# Patient Record
Sex: Male | Born: 1995 | Race: White | Hispanic: No | Marital: Single
Health system: Southern US, Community
[De-identification: ages and names within clinical notes are randomized; demographics above are authoritative.]

## PROBLEM LIST (undated history)

## (undated) DIAGNOSIS — R519 Headache, unspecified: Secondary | ICD-10-CM

## (undated) DIAGNOSIS — R1110 Vomiting, unspecified: Secondary | ICD-10-CM

## (undated) DIAGNOSIS — R011 Cardiac murmur, unspecified: Secondary | ICD-10-CM

## (undated) DIAGNOSIS — G039 Meningitis, unspecified: Secondary | ICD-10-CM

## (undated) DIAGNOSIS — R5383 Other fatigue: Secondary | ICD-10-CM

## (undated) DIAGNOSIS — K5900 Constipation, unspecified: Secondary | ICD-10-CM

## (undated) DIAGNOSIS — R51 Headache: Secondary | ICD-10-CM

## (undated) DIAGNOSIS — R110 Nausea: Secondary | ICD-10-CM

## (undated) HISTORY — DX: Headache: R51

## (undated) HISTORY — DX: Constipation, unspecified: K59.00

## (undated) HISTORY — DX: Vomiting, unspecified: R11.10

## (undated) HISTORY — PX: CIRCUMCISION: SUR203

## (undated) HISTORY — DX: Nausea: R11.0

## (undated) HISTORY — DX: Headache, unspecified: R51.9

## (undated) HISTORY — DX: Other fatigue: R53.83

---
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
## 2017-10-06 ENCOUNTER — Other Ambulatory Visit: Payer: Self-pay | Admitting: Gastroenterology

## 2017-10-06 DIAGNOSIS — R110 Nausea: Secondary | ICD-10-CM

## 2017-10-06 DIAGNOSIS — R1011 Right upper quadrant pain: Secondary | ICD-10-CM

## 2017-10-06 DIAGNOSIS — R1013 Epigastric pain: Secondary | ICD-10-CM

## 2017-10-23 ENCOUNTER — Ambulatory Visit
Admission: RE | Admit: 2017-10-23 | Discharge: 2017-10-23 | Disposition: A | Discharge status: Home / Self Care | Payer: Medicaid Other | Source: Ambulatory Visit | Attending: Gastroenterology | Admitting: Gastroenterology

## 2017-10-23 DIAGNOSIS — R1011 Right upper quadrant pain: Secondary | ICD-10-CM

## 2017-10-23 DIAGNOSIS — R1013 Epigastric pain: Secondary | ICD-10-CM

## 2017-10-23 DIAGNOSIS — R110 Nausea: Secondary | ICD-10-CM

## 2017-10-23 MED ORDER — TECHNETIUM TC 99M MEBROFENIN IV KIT
5.0000 | PACK | Freq: Once | INTRAVENOUS | Status: AC | PRN
Start: 2017-10-23 — End: 2017-10-23
  Administered 2017-10-23: 5.35 via INTRAVENOUS

## 2019-02-21 ENCOUNTER — Emergency Department
Admission: EM | Admit: 2019-02-21 | Discharge: 2019-02-21 | Disposition: A | Discharge status: Home / Self Care | Payer: Medicaid Other | Attending: Student in an Organized Health Care Education/Training Program | Admitting: Student in an Organized Health Care Education/Training Program

## 2019-02-21 ENCOUNTER — Other Ambulatory Visit: Payer: Self-pay

## 2019-02-21 ENCOUNTER — Encounter: Payer: Self-pay | Admitting: Emergency Medicine

## 2019-02-21 DIAGNOSIS — Z79899 Other long term (current) drug therapy: Secondary | ICD-10-CM | POA: Diagnosis not present

## 2019-02-21 DIAGNOSIS — J302 Other seasonal allergic rhinitis: Secondary | ICD-10-CM | POA: Insufficient documentation

## 2019-02-21 DIAGNOSIS — Z7722 Contact with and (suspected) exposure to environmental tobacco smoke (acute) (chronic): Secondary | ICD-10-CM | POA: Diagnosis not present

## 2019-02-21 DIAGNOSIS — J3489 Other specified disorders of nose and nasal sinuses: Secondary | ICD-10-CM | POA: Diagnosis present

## 2019-02-21 MED ORDER — CETIRIZINE HCL 10 MG PO TABS: 10.0000 mg | ORAL_TABLET | Freq: Every day | ORAL | 0 refills | Status: AC

## 2019-02-21 MED ORDER — OLOPATADINE HCL 0.6 % NA SOLN: 1.0000 | Freq: Two times a day (BID) | NASAL | 0 refills | Status: AC | PRN

## 2019-02-21 NOTE — Discharge Instructions (Addendum)
Please begin cetirizine daily.  You can also use Patanase spray if your Flonase is not helping.

## 2019-02-21 NOTE — ED Triage Notes (Signed)
Pt c/o sinus drainage and sore throat. PT hx of allergies. PT in NAD, VSS . Denies fever or cough

## 2019-02-21 NOTE — ED Notes (Signed)
See triage note  Presents with sinus pressure/drainage  Also has a sore throat  Low grade fever on arrival

## 2019-02-21 NOTE — ED Provider Notes (Signed)
[facility] Emergency Department Provider Note  ____________________________________________  Time seen: Approximately 10:04 AM  I have reviewed the triage vital signs and the nursing notes.   HISTORY  Chief Complaint No chief complaint on file.    HPI Dean Hudson is a 23 y.o. male that presents to the emergency department for evaluation of rhinorrhea for 2 days.  Patient states that his throat was scratchy yesterday but is better today.  Rhinorrhea has improved today as well.  No nasal congestion.  Patient has seasonal allergies.  He saw an allergy specialist in the past and was told that his allergies will be worse in the fall.  No contacts with COVID-19.  No fevers, cough, shortness of breath.  Past Medical History:  Diagnosis Date  . Constipation   . Fatigue   . Headache   . Heart murmur   . Meningitis    As a newborn  . Nausea   . Vomiting     Patient Active Problem List   Diagnosis Date Noted  . Migraine without aura 02/08/2014  . Episodic tension type headache 02/08/2014  . Eosinophilic esophagitis 01/19/2014  . Headache(784.0) 12/27/2013  . Abdominal pain, LLQ (left lower quadrant) 11/03/2013  . Nausea with vomiting   . Unspecified constipation   . Recurrent occipital headache   . Fatigue     Past Surgical History:  Procedure Laterality Date  . APPENDECTOMY  07/12/2013  . CIRCUMCISION  1999  . ESOPHAGOGASTRODUODENOSCOPY N/A 01/13/2014   Procedure: ESOPHAGOGASTRODUODENOSCOPY (EGD);  Surgeon: Jon GillsJoseph H Clark, MD;  Location: [facility] ENDOSCOPY;  Service: Endoscopy;  Laterality: N/A;    Prior to Admission medications   Medication Sig Start Date End Date Taking? Authorizing Provider  cetirizine (ZYRTEC ALLERGY) 10 MG tablet Take 1 tablet (10 mg total) by mouth daily. 02/21/19   [place] DerryWagner, Maya Arcand, PA-C  Olopatadine HCl (PATANASE) 0.6 % SOLN Place 1 spray into the nose 2 (two) times daily as needed. 02/21/19   [place] DerryWagner, Amity Roes, PA-C  pantoprazole  (PROTONIX) 20 MG tablet Take 1 tablet (20 mg total) by mouth daily. 01/19/14 01/20/15  Jon Gillslark, Joseph H, MD  SUMAtriptan (IMITREX) 50 MG tablet Take one tablet at onset of migraine with nonsteroidal medication, may repeat in 2 hours if headache persists or recurs. 05/16/14   Deetta PerlaHickling, William H, MD  topiramate (TOPAMAX) 25 MG tablet Take 2 tablets at nighttime 05/16/14   Deetta PerlaHickling, William H, MD    Allergies Patient has no known allergies.  Family History  Problem Relation Age of Onset  . Hypertension Mother   . Cholelithiasis Neg Hx   . Ulcers Neg Hx   . Celiac disease Neg Hx   . Migraines Neg Hx     Social History Social History   Tobacco Use  . Smoking status: Passive Smoke Exposure - Never Smoker  . Smokeless tobacco: Never Used  . Tobacco comment: Mom smokes   Substance Use Topics  . Alcohol use: No    Alcohol/week: 0.0 standard drinks  . Drug use: No     Review of Systems  Constitutional: No fever/chills Eyes: No visual changes. No discharge. ENT: Positive for  rhinorrhea. Cardiovascular: No chest pain. Respiratory: Negative for cough. No SOB. Gastrointestinal: No abdominal pain.  No vomiting.  No diarrhea.  No constipation. Musculoskeletal: Negative for musculoskeletal pain. Skin: Negative for rash, abrasions, lacerations, ecchymosis. Neurological: Negative for headaches.   ____________________________________________   PHYSICAL EXAM:  VITAL SIGNS: ED Triage Vitals  Enc Vitals Group  BP 02/21/19 0934 (!) 143/89     Pulse Rate 02/21/19 0934 96     Resp 02/21/19 0934 16     Temp 02/21/19 0934 99.1 F (37.3 C)     Temp Source 02/21/19 0934 Oral     SpO2 02/21/19 0934 99 %     Weight 02/21/19 0935 140 lb (63.5 kg)     Height 02/21/19 0935 5\' 8"  (1.727 m)     Head Circumference --      Peak Flow --      Pain Score 02/21/19 0934 0     Pain Loc --      Pain Edu? --      Excl. in GC? --      Constitutional: Alert and oriented. Well appearing and in  no acute distress. Eyes: Conjunctivae are normal. PERRL. EOMI. No discharge. Head: Atraumatic. ENT: No frontal and maxillary sinus tenderness.      Ears: Tympanic membranes pearly gray with good landmarks. No discharge.      Nose: Mild rhinnorhea.      Mouth/Throat: Mucous membranes are moist. Oropharynx non-erythematous. Tonsils not enlarged. No exudates. Uvula midline. Neck: No stridor.   Hematological/Lymphatic/Immunilogical: No cervical lymphadenopathy. Cardiovascular: Normal rate, regular rhythm.  Good peripheral circulation. Respiratory: Normal respiratory effort without tachypnea or retractions. Lungs CTAB. Good air entry to the bases with no decreased or absent breath sounds. Gastrointestinal: Bowel sounds 4 quadrants. Soft and nontender to palpation. No guarding or rigidity. No palpable masses. No distention. Musculoskeletal: Full range of motion to all extremities. No gross deformities appreciated. Neurologic:  Normal speech and language. No gross focal neurologic deficits are appreciated.  Skin:  Skin is warm, dry and intact. No rash noted. Psychiatric: Mood and affect are normal. Speech and behavior are normal. Patient exhibits appropriate insight and judgement.   ____________________________________________   LABS (all labs ordered are listed, but only abnormal results are displayed)  Labs Reviewed - No data to display ____________________________________________  EKG   ____________________________________________  RADIOLOGY  No results found.  ____________________________________________    PROCEDURES  Procedure(s) performed:    Procedures    Medications - No data to display   ____________________________________________   INITIAL IMPRESSION / ASSESSMENT AND PLAN / ED COURSE  Pertinent labs & imaging results that were available during my care of the patient were reviewed by me and considered in my medical decision making (see chart for  details).  Review of the [place] CSRS was performed in accordance of the [facility] prior to dispensing any controlled drugs.     Patient's diagnosis is consistent with allergic rhinitis. Vital signs and exam are reassuring. Patient appears well and is staying well hydrated. Patient feels comfortable going home. Patient will be discharged home with prescriptions for zyrtec and patanase. Patient is to follow up with primary care as needed or otherwise directed. Patient is given ED precautions to return to the ED for any worsening or new symptoms.  Dean Hudson was evaluated in Emergency Department on 02/21/2019 for the symptoms described in the history of present illness. He was evaluated in the context of the global COVID-19 pandemic, which necessitated consideration that the patient might be at risk for infection with the SARS-CoV-2 virus that causes COVID-19. Institutional protocols and algorithms that pertain to the evaluation of patients at risk for COVID-19 are in a state of rapid change based on information released by regulatory bodies including the CDC and federal and state organizations. These policies and algorithms were followed during  the patient's care in the ED.     ____________________________________________  FINAL CLINICAL IMPRESSION(S) / ED DIAGNOSES  Final diagnoses:  Seasonal allergic rhinitis, unspecified trigger      NEW MEDICATIONS STARTED DURING THIS VISIT:  ED Discharge Orders         Ordered    cetirizine (ZYRTEC ALLERGY) 10 MG tablet  Daily     02/21/19 1012    Olopatadine HCl (PATANASE) 0.6 % SOLN  2 times daily PRN     02/21/19 1015              This chart was dictated using voice recognition software/Dragon. Despite best efforts to proofread, errors can occur which can change the meaning. Any change was purely unintentional.    Laban Emperor, PA-C 02/21/19 1142    Merlyn Lot, MD 02/21/19 1235

## 2020-02-11 ENCOUNTER — Emergency Department: Payer: Medicaid Other

## 2020-02-11 ENCOUNTER — Encounter: Payer: Self-pay | Admitting: Emergency Medicine

## 2020-02-11 ENCOUNTER — Other Ambulatory Visit: Payer: Self-pay

## 2020-02-11 ENCOUNTER — Emergency Department
Admission: EM | Admit: 2020-02-11 | Discharge: 2020-02-11 | Disposition: A | Discharge status: Home / Self Care | Payer: Medicaid Other | Attending: Emergency Medicine | Admitting: Emergency Medicine

## 2020-02-11 DIAGNOSIS — S46012A Strain of muscle(s) and tendon(s) of the rotator cuff of left shoulder, initial encounter: Secondary | ICD-10-CM | POA: Insufficient documentation

## 2020-02-11 DIAGNOSIS — M542 Cervicalgia: Secondary | ICD-10-CM | POA: Insufficient documentation

## 2020-02-11 DIAGNOSIS — R519 Headache, unspecified: Secondary | ICD-10-CM | POA: Insufficient documentation

## 2020-02-11 DIAGNOSIS — Y9289 Other specified places as the place of occurrence of the external cause: Secondary | ICD-10-CM | POA: Diagnosis not present

## 2020-02-11 DIAGNOSIS — Z7722 Contact with and (suspected) exposure to environmental tobacco smoke (acute) (chronic): Secondary | ICD-10-CM | POA: Diagnosis not present

## 2020-02-11 DIAGNOSIS — W11XXXA Fall on and from ladder, initial encounter: Secondary | ICD-10-CM | POA: Insufficient documentation

## 2020-02-11 DIAGNOSIS — Y999 Unspecified external cause status: Secondary | ICD-10-CM | POA: Insufficient documentation

## 2020-02-11 DIAGNOSIS — T1490XA Injury, unspecified, initial encounter: Secondary | ICD-10-CM

## 2020-02-11 DIAGNOSIS — S20212A Contusion of left front wall of thorax, initial encounter: Secondary | ICD-10-CM | POA: Diagnosis not present

## 2020-02-11 DIAGNOSIS — Y9389 Activity, other specified: Secondary | ICD-10-CM | POA: Diagnosis not present

## 2020-02-11 DIAGNOSIS — S46912A Strain of unspecified muscle, fascia and tendon at shoulder and upper arm level, left arm, initial encounter: Secondary | ICD-10-CM

## 2020-02-11 DIAGNOSIS — S4992XA Unspecified injury of left shoulder and upper arm, initial encounter: Secondary | ICD-10-CM | POA: Diagnosis present

## 2020-02-11 LAB — COMPREHENSIVE METABOLIC PANEL
ALT: 15 U/L (ref 0–44)
AST: 35 U/L (ref 15–41)
Albumin: 5.2 g/dL — ABNORMAL HIGH (ref 3.5–5.0)
Alkaline Phosphatase: 56 U/L (ref 38–126)
Anion gap: 10 (ref 5–15)
BUN: 12 mg/dL (ref 6–20)
CO2: 26 mmol/L (ref 22–32)
Calcium: 9.7 mg/dL (ref 8.9–10.3)
Chloride: 102 mmol/L (ref 98–111)
Creatinine, Ser: 1.18 mg/dL (ref 0.61–1.24)
GFR calc Af Amer: >60 mL/min (ref >60)
GFR calc non Af Amer: >60 mL/min (ref >60)
Glucose, Bld: 90 mg/dL (ref 70–99)
Potassium: 3.4 mmol/L — ABNORMAL LOW (ref 3.5–5.1)
Sodium: 138 mmol/L (ref 135–145)
Total Bilirubin: 1.7 mg/dL — ABNORMAL HIGH (ref 0.3–1.2)
Total Protein: 7.8 g/dL (ref 6.5–8.1)

## 2020-02-11 LAB — CBC WITH DIFFERENTIAL/PLATELET
Abs Immature Granulocytes: 0.03 10*3/uL (ref 0.00–0.07)
Basophils Absolute: 0 10*3/uL (ref 0.0–0.1)
Basophils Relative: 1 %
Eosinophils Absolute: 0.1 10*3/uL (ref 0.0–0.5)
Eosinophils Relative: 1 %
HCT: 38.8 % — ABNORMAL LOW (ref 39.0–52.0)
Hemoglobin: 14.2 g/dL (ref 13.0–17.0)
Immature Granulocytes: 0 %
Lymphocytes Relative: 16 %
Lymphs Abs: 1.3 10*3/uL (ref 0.7–4.0)
MCH: 29.5 pg (ref 26.0–34.0)
MCHC: 36.6 g/dL — ABNORMAL HIGH (ref 30.0–36.0)
MCV: 80.7 fL (ref 80.0–100.0)
Monocytes Absolute: 0.8 10*3/uL (ref 0.1–1.0)
Monocytes Relative: 10 %
Neutro Abs: 6 10*3/uL (ref 1.7–7.7)
Neutrophils Relative %: 72 %
Platelets: 220 10*3/uL (ref 150–400)
RBC: 4.81 MIL/uL (ref 4.22–5.81)
RDW: 12.6 % (ref 11.5–15.5)
WBC: 8.3 10*3/uL (ref 4.0–10.5)
nRBC: 0 % (ref 0.0–0.2)

## 2020-02-11 LAB — LIPASE, BLOOD: Lipase: 26 U/L (ref 11–51)

## 2020-02-11 LAB — TROPONIN I (HIGH SENSITIVITY): Troponin I (High Sensitivity): 8 ng/L (ref <18)

## 2020-02-11 MED ORDER — ACETAMINOPHEN 325 MG PO TABS: 650.0000 mg | ORAL_TABLET | Freq: Four times a day (QID) | ORAL | 0 refills | Status: AC | PRN

## 2020-02-11 MED ORDER — KETOROLAC TROMETHAMINE 10 MG PO TABS: 10.0000 mg | ORAL_TABLET | Freq: Four times a day (QID) | ORAL | 0 refills | Status: AC | PRN

## 2020-02-11 MED ORDER — IOHEXOL 350 MG/ML SOLN
100.0000 mL | Freq: Once | INTRAVENOUS | Status: AC | PRN
  Administered 2020-02-11: 100 mL via INTRAVENOUS

## 2020-02-11 NOTE — ED Notes (Signed)
Pt transported to CT ?

## 2020-02-11 NOTE — Discharge Instructions (Addendum)
Your blood tests, shoulder x-ray, and CT scans are all okay today.  There are no serious injuries from the fall.  Use a shoulder sling as needed for comfort over the next several days, and take ketorolac and Tylenol for relief of pain and inflammation from the injury.  While taking ketorolac, do not take Aleve or ibuprofen.

## 2020-02-11 NOTE — ED Triage Notes (Addendum)
Pt was up on ladder and fell off. Fall was about 13-15 feet.  Pt unsure of how he landed but was told he landed then rolled. Pain to left shoulder.  Feels like left arm is weak.  C/o tight feeling to neck.  C/o bad headache. Denies LOC at this time.  Ambulatory, denies hip/leg pain.  philly applied in triage.  Lips appear very pale.  Pt reports not his normal color.  Describes abdomen as tensing but not having pain to abdomen.  Shivering but reports not cold.

## 2020-02-11 NOTE — ED Provider Notes (Signed)
[facility] Emergency Department Provider Note  ____________________________________________  Time seen: Approximately 4:41 PM  I have reviewed the triage vital signs and the nursing notes.   HISTORY  Chief Complaint Fall    HPI Dean Hudson is a 24 y.o. male with a past history of migraines and recurrent occipital headache who was in his usual state of health today until he fell off a ladder.  He estimates he was about 15 feet in the air and fell straight off of it, landing on his back.  Denies loss of consciousness.  He had immediate occipital headache, neck tightness, left upper chest and shoulder pain, and a feeling of weakness and pain radiating through the left arm.  Also describes some abdominal discomfort which is vague and generalized.  No vomiting, hematuria, or diarrhea.  He does endorse some nausea.  Pain is currently 5/10, constant, worse with shoulder movement.  C-collar was placed on the patient on arrival to the ED.  Injury happened at about 1:30 PM.    Past Medical History:  Diagnosis Date  . Constipation   . Fatigue   . Headache   . Heart murmur   . Meningitis    As a newborn  . Nausea   . Vomiting      Patient Active Problem List   Diagnosis Date Noted  . Migraine without aura 02/08/2014  . Episodic tension type headache 02/08/2014  . Eosinophilic esophagitis 01/19/2014  . Headache(784.0) 12/27/2013  . Abdominal pain, LLQ (left lower quadrant) 11/03/2013  . Nausea with vomiting   . Unspecified constipation   . Recurrent occipital headache   . Fatigue      Past Surgical History:  Procedure Laterality Date  . APPENDECTOMY  07/12/2013  . CIRCUMCISION  1999  . ESOPHAGOGASTRODUODENOSCOPY N/A 01/13/2014   Procedure: ESOPHAGOGASTRODUODENOSCOPY (EGD);  Surgeon: Jon Gills, MD;  Location: [facility] ENDOSCOPY;  Service: Endoscopy;  Laterality: N/A;     Prior to Admission medications   Medication Sig Start Date End Date Taking?  Authorizing Provider  acetaminophen (TYLENOL) 325 MG tablet Take 2 tablets (650 mg total) by mouth every 6 (six) hours as needed. 02/11/20   Sharman Cheek, MD  cetirizine (ZYRTEC ALLERGY) 10 MG tablet Take 1 tablet (10 mg total) by mouth daily. 02/21/19   Enid Derry, PA-C  ketorolac (TORADOL) 10 MG tablet Take 1 tablet (10 mg total) by mouth every 6 (six) hours as needed for moderate pain. 02/11/20   Sharman Cheek, MD  Olopatadine HCl (PATANASE) 0.6 % SOLN Place 1 spray into the nose 2 (two) times daily as needed. 02/21/19   Enid Derry, PA-C  pantoprazole (PROTONIX) 20 MG tablet Take 1 tablet (20 mg total) by mouth daily. 01/19/14 01/20/15  Jon Gills, MD  SUMAtriptan (IMITREX) 50 MG tablet Take one tablet at onset of migraine with nonsteroidal medication, may repeat in 2 hours if headache persists or recurs. 05/16/14   Deetta Perla, MD  topiramate (TOPAMAX) 25 MG tablet Take 2 tablets at nighttime 05/16/14   Deetta Perla, MD     Allergies Patient has no known allergies.   Family History  Problem Relation Age of Onset  . Hypertension Mother   . Cholelithiasis Neg Hx   . Ulcers Neg Hx   . Celiac disease Neg Hx   . Migraines Neg Hx     Social History Social History   Tobacco Use  . Smoking status: Passive Smoke Exposure - Never Smoker  . Smokeless  tobacco: Never Used  . Tobacco comment: Mom smokes   Substance Use Topics  . Alcohol use: No    Alcohol/week: 0.0 standard drinks  . Drug use: No    Review of Systems  Constitutional:   No fever or chills.  ENT:   No sore throat. No rhinorrhea. Cardiovascular:   Positive left upper chest pain as above without syncope. Respiratory:   No dyspnea or cough. Gastrointestinal:   Negative for abdominal pain, vomiting and diarrhea.  Musculoskeletal:   Positive left shoulder pain All other systems reviewed and are negative except as documented above in ROS and  HPI.  ____________________________________________   PHYSICAL EXAM:  VITAL SIGNS: ED Triage Vitals  Enc Vitals Group     BP 02/11/20 1524 136/88     Pulse Rate 02/11/20 1524 70     Resp 02/11/20 1524 17     Temp 02/11/20 1525 98.7 F (37.1 C)     Temp Source 02/11/20 1525 Oral     SpO2 02/11/20 1524 100 %     Weight 02/11/20 1528 138 lb (62.6 kg)     Height 02/11/20 1528 5\' 8"  (1.727 m)     Head Circumference --      Peak Flow --      Pain Score 02/11/20 1526 8     Pain Loc --      Pain Edu? --      Excl. in GC? --     Vital signs reviewed, nursing assessments reviewed.   Constitutional:   Alert and oriented. Non-toxic appearance. Eyes:   Conjunctivae are normal. EOMI. PERRL. ENT      Head:   Normocephalic and atraumatic.  Negative battle sign or raccoon eyes      Nose: No epistaxis      Mouth/Throat:   No intraoral injury      Neck:   No meningismus. Full ROM.  Trachea midline.  No midline point tenderness of the cervical spine Hematological/Lymphatic/Immunilogical:   No cervical lymphadenopathy. Cardiovascular:   RRR. Symmetric bilateral radial and DP pulses.  No murmurs. Cap refill less than 2 seconds. Respiratory:   Normal respiratory effort without tachypnea/retractions. Breath sounds are clear and equal bilaterally. No wheezes/rales/rhonchi. Gastrointestinal:   Soft and nontender. Non distended. There is no CVA tenderness.  No rebound, rigidity, or guarding. Genitourinary:   deferred Musculoskeletal:   There is tenderness of the posterior left chest wall without rib instability or flail chest.  There is tenderness at the left acromion and superior humerus, but intact shoulder range of motion. Neurologic:   Normal speech and language.  Motor grossly intact. Sensation intact and symmetric No acute focal neurologic deficits are appreciated.  Skin:    Skin is warm, dry and intact. No rash noted.  No petechiae, purpura, or  bullae.  ____________________________________________    LABS (pertinent positives/negatives) (all labs ordered are listed, but only abnormal results are displayed) Labs Reviewed  COMPREHENSIVE METABOLIC PANEL - Abnormal; Notable for the following components:      Result Value   Potassium 3.4 (*)    Albumin 5.2 (*)    Total Bilirubin 1.7 (*)    All other components within normal limits  CBC WITH DIFFERENTIAL/PLATELET - Abnormal; Notable for the following components:   HCT 38.8 (*)    MCHC 36.6 (*)    All other components within normal limits  LIPASE, BLOOD  TROPONIN I (HIGH SENSITIVITY)  TROPONIN I (HIGH SENSITIVITY)   ____________________________________________   EKG  ____________________________________________    RADIOLOGY  CT HEAD WO CONTRAST  Result Date: 02/11/2020 CLINICAL DATA:  24 year old who fell approximately 13-15 feet off of a ladder earlier today. No loss of consciousness. Cervical neck pain and tightness. LEFT shoulder pain. Initial encounter. EXAM: CT HEAD WITHOUT CONTRAST CT CERVICAL SPINE WITHOUT CONTRAST TECHNIQUE: Multidetector CT imaging of the head and cervical spine was performed following the standard protocol without intravenous contrast. Multiplanar CT image reconstructions of the cervical spine were also generated. COMPARISON:  None. FINDINGS: CT HEAD FINDINGS Brain: Ventricular system normal in size and appearance for age. No mass lesion. No midline shift. No acute hemorrhage or hematoma. No extra-axial fluid collections. No evidence of acute infarction. No focal brain parenchymal abnormalities. Vascular: No hyperdense vessel.  No visible atherosclerosis. Skull: No skull fracture or other focal osseous abnormality involving the skull. Sinuses/Orbits: Visualized paranasal sinuses, bilateral mastoid air cells and bilateral middle ear cavities well-aerated. Visualized orbits and globes normal in appearance. Other: None. CT CERVICAL SPINE FINDINGS  Alignment: Anatomic alignment. Straightening of the usual lordosis. Facet joints anatomically aligned throughout. Skull base and vertebrae: No fractures identified involving the cervical spine. Coronal reformatted images demonstrate an intact craniocervical junction, intact dens and intact lateral masses throughout Soft tissues and spinal canal: No evidence of paraspinous or spinal canal hematoma. No evidence of spinal stenosis. Disc levels: Disc spaces well-preserved throughout. No evidence of disc protrusion or extrusion. Neural foramina widely patent throughout. Upper chest: Visualized lung apices clear. Visualized superior mediastinum normal. Other: None. IMPRESSION: 1. Normal unenhanced head CT. 2. No cervical spine fractures identified. 3. Straightening of the usual cervical lordosis likely due to positioning and/or spasm. Electronically Signed   By: Hulan Saashomas  Lawrence M.D.   On: 02/11/2020 17:36   CT CERVICAL SPINE WO CONTRAST  Result Date: 02/11/2020 CLINICAL DATA:  24 year old who fell approximately 13-15 feet off of a ladder earlier today. No loss of consciousness. Cervical neck pain and tightness. LEFT shoulder pain. Initial encounter. EXAM: CT HEAD WITHOUT CONTRAST CT CERVICAL SPINE WITHOUT CONTRAST TECHNIQUE: Multidetector CT imaging of the head and cervical spine was performed following the standard protocol without intravenous contrast. Multiplanar CT image reconstructions of the cervical spine were also generated. COMPARISON:  None. FINDINGS: CT HEAD FINDINGS Brain: Ventricular system normal in size and appearance for age. No mass lesion. No midline shift. No acute hemorrhage or hematoma. No extra-axial fluid collections. No evidence of acute infarction. No focal brain parenchymal abnormalities. Vascular: No hyperdense vessel.  No visible atherosclerosis. Skull: No skull fracture or other focal osseous abnormality involving the skull. Sinuses/Orbits: Visualized paranasal sinuses, bilateral mastoid  air cells and bilateral middle ear cavities well-aerated. Visualized orbits and globes normal in appearance. Other: None. CT CERVICAL SPINE FINDINGS Alignment: Anatomic alignment. Straightening of the usual lordosis. Facet joints anatomically aligned throughout. Skull base and vertebrae: No fractures identified involving the cervical spine. Coronal reformatted images demonstrate an intact craniocervical junction, intact dens and intact lateral masses throughout Soft tissues and spinal canal: No evidence of paraspinous or spinal canal hematoma. No evidence of spinal stenosis. Disc levels: Disc spaces well-preserved throughout. No evidence of disc protrusion or extrusion. Neural foramina widely patent throughout. Upper chest: Visualized lung apices clear. Visualized superior mediastinum normal. Other: None. IMPRESSION: 1. Normal unenhanced head CT. 2. No cervical spine fractures identified. 3. Straightening of the usual cervical lordosis likely due to positioning and/or spasm. Electronically Signed   By: Hulan Saashomas  Lawrence M.D.   On: 02/11/2020 17:36   CT CHEST ABDOMEN  PELVIS W CONTRAST  Result Date: 02/11/2020 CLINICAL DATA:  Larey Seat from a ladder. Left chest pain and abdominal discomfort. EXAM: CT CHEST, ABDOMEN, AND PELVIS WITH CONTRAST TECHNIQUE: Multidetector CT imaging of the chest, abdomen and pelvis was performed following the standard protocol during bolus administration of intravenous contrast. CONTRAST:  OMNIPAQUE IOHEXOL 350 MG/ML SOLN COMPARISON:  None. FINDINGS: CT CHEST FINDINGS Cardiovascular: The heart is normal in size. No pericardial effusion. The aorta is normal in caliber. No dissection. The branch vessels are patent. The pulmonary arteries appear normal. Mediastinum/Nodes: No mediastinal or hilar mass or adenopathy or hematoma. Some residual normal thymic tissue noted in the anterior mediastinum. The esophagus is grossly normal. The thyroid gland is unremarkable. Lungs/Pleura: The lungs are  clear. No pulmonary contusion, pneumothorax or pleural effusion. No pulmonary lesions. Musculoskeletal: No rib, sternal or thoracic vertebral body fractures are identified. CT ABDOMEN PELVIS FINDINGS Hepatobiliary: No acute hepatic injury or perihepatic fluid collection. The gallbladder is unremarkable. No common bile duct dilatation. Pancreas: No mass, inflammation or ductal dilatation. No findings for acute pancreatic injury and no peripancreatic fluid collections. Spleen: Normal size. No acute injury or perisplenic fluid collection. Adrenals/Urinary Tract: The adrenal glands and kidneys are unremarkable. No acute renal injury or perinephric fluid collection. The bladder is unremarkable. Stomach/Bowel: The stomach, duodenum, small bowel and colon are grossly normal. No CT findings to suggest an acute bowel injury. No free fluid or free air. Vascular/Lymphatic: The aorta is normal in caliber. No dissection. The branch vessels are patent. The major venous structures are patent. No mesenteric or retroperitoneal mass or adenopathy. Small scattered lymph nodes are noted. Reproductive: The prostate gland and seminal vesicles are unremarkable. Other: No pelvic mass or adenopathy. No free pelvic fluid collections. No inguinal mass or adenopathy. No abdominal wall hernia or subcutaneous lesions. Musculoskeletal: The lumbar vertebral bodies are normally aligned. No fracture. The facets are normally aligned. No facet or laminar fractures. No pars defects. The pubic symphysis and SI joints are intact. Both hips are normally located. No pelvic fractures or bone lesions. IMPRESSION: Unremarkable CT examination of the chest, abdomen and pelvis. No acute injury is identified. Electronically Signed   By: Rudie Meyer M.D.   On: 02/11/2020 17:34   DG Shoulder Left  Result Date: 02/11/2020 CLINICAL DATA:  Left shoulder pain status post fall from ladder. EXAM: LEFT SHOULDER - 2+ VIEW COMPARISON:  None. FINDINGS: There is no  evidence of fracture or dislocation. There is no evidence of arthropathy or other focal bone abnormality. Soft tissues are unremarkable. IMPRESSION: Negative. Electronically Signed   By: Katherine Mantle M.D.   On: 02/11/2020 17:41    ____________________________________________   PROCEDURES Procedures  ____________________________________________  DIFFERENTIAL DIAGNOSIS   Intracranial hemorrhage, cervical spine fracture, aortic dissection, pneumothorax, rib fracture, shoulder fracture, rotator cuff/musculoskeletal strain, splenic laceration, liver laceration  CLINICAL IMPRESSION / ASSESSMENT AND PLAN / ED COURSE  Medications ordered in the ED: Medications  iohexol (OMNIPAQUE) 350 MG/ML injection 100 mL (100 mLs Intravenous Contrast Given 02/11/20 1702)    Pertinent labs & imaging results that were available during my care of the patient were reviewed by me and considered in my medical decision making (see chart for details).  Dean Hudson was evaluated in Emergency Department on 02/11/2020 for the symptoms described in the history of present illness. He was evaluated in the context of the global COVID-19 pandemic, which necessitated consideration that the patient might be at risk for infection with the SARS-CoV-2 virus that  causes COVID-19. Institutional protocols and algorithms that pertain to the evaluation of patients at risk for COVID-19 are in a state of rapid change based on information released by regulatory bodies including the CDC and federal and state organizations. These policies and algorithms were followed during the patient's care in the ED.   Patient presents after a serious mechanism of injury, 15 foot fall off a ladder with head injury, headache neck pain chest and arm pain, concerning for intracranial hemorrhage, C-spine fracture, aortic injury, fracture, hemothorax, abdominal organ laceration.  Will pursue trauma work-up with CT head and neck, CT chest abdomen pelvis,  shoulder x-ray.  Clinical Course as of Feb 11 1748  Sat Feb 11, 2020  5364 W/u reassuring. Labs normal. CT head/neck/CAP all negative. Shoulder xray interpreted by me, normal without fracture, dislocation, ptx, rib fx, or clavicle injury.    [PS]    Clinical Course User Index [PS] Sharman Cheek, MD     ____________________________________________   FINAL CLINICAL IMPRESSION(S) / ED DIAGNOSES    Final diagnoses:  Strain of left shoulder, initial encounter  Chest wall contusion, left, initial encounter     ED Discharge Orders         Ordered    ketorolac (TORADOL) 10 MG tablet  Every 6 hours PRN        02/11/20 1747    acetaminophen (TYLENOL) 325 MG tablet  Every 6 hours PRN        02/11/20 1747          Portions of this note were generated with dragon dictation software. Dictation errors may occur despite best attempts at proofreading.   Sharman Cheek, MD 02/11/20 1749

## 2021-04-12 ENCOUNTER — Emergency Department
Admission: EM | Admit: 2021-04-12 | Discharge: 2021-04-12 | Disposition: A | Discharge status: Home / Self Care | Payer: No Typology Code available for payment source | Attending: Emergency Medicine | Admitting: Emergency Medicine

## 2021-04-12 ENCOUNTER — Emergency Department: Payer: No Typology Code available for payment source

## 2021-04-12 ENCOUNTER — Other Ambulatory Visit: Payer: Self-pay

## 2021-04-12 DIAGNOSIS — S161XXA Strain of muscle, fascia and tendon at neck level, initial encounter: Secondary | ICD-10-CM | POA: Diagnosis not present

## 2021-04-12 DIAGNOSIS — S0990XA Unspecified injury of head, initial encounter: Secondary | ICD-10-CM | POA: Diagnosis not present

## 2021-04-12 DIAGNOSIS — S199XXA Unspecified injury of neck, initial encounter: Secondary | ICD-10-CM | POA: Diagnosis present

## 2021-04-12 DIAGNOSIS — Y92410 Unspecified street and highway as the place of occurrence of the external cause: Secondary | ICD-10-CM | POA: Diagnosis not present

## 2021-04-12 DIAGNOSIS — Z7722 Contact with and (suspected) exposure to environmental tobacco smoke (acute) (chronic): Secondary | ICD-10-CM | POA: Insufficient documentation

## 2021-04-12 MED ORDER — IBUPROFEN 600 MG PO TABS
600.0000 mg | ORAL_TABLET | Freq: Once | ORAL | Status: AC
  Administered 2021-04-12: 600 mg via ORAL
  Filled 2021-04-12: qty 1

## 2021-04-12 MED ORDER — BACLOFEN 10 MG PO TABS: 10.0000 mg | ORAL_TABLET | Freq: Three times a day (TID) | ORAL | 0 refills | Status: AC

## 2021-04-12 MED ORDER — MELOXICAM 15 MG PO TABS: 15.0000 mg | ORAL_TABLET | Freq: Every day | ORAL | 2 refills | Status: AC

## 2021-04-12 MED ORDER — CYCLOBENZAPRINE HCL 10 MG PO TABS
10.0000 mg | ORAL_TABLET | Freq: Once | ORAL | Status: AC
  Administered 2021-04-12: 10 mg via ORAL
  Filled 2021-04-12: qty 1

## 2021-04-12 NOTE — Discharge Instructions (Signed)
Follow-up with your regular doctor or orthopedics if not improving in 1 week.  Use medication as prescribed.  Apply ice to all areas that hurt.  Return if worsening
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
## 2021-10-11 IMAGING — CT CT CHEST-ABD-PELV W/ CM
2 of 5 series · 13 of 36 positions shown, 15 images · IV contrast (omnipaque)
Comparison: None.

CLINICAL DATA: Fell from a ladder. Left chest pain and abdominal
discomfort.

EXAM:
CT CHEST, ABDOMEN, AND PELVIS WITH CONTRAST
TECHNIQUE: Multidetector CT imaging of the chest, abdomen and pelvis was
performed following the standard protocol during bolus
administration of intravenous contrast.
CONTRAST:  100mL OMNIPAQUE IOHEXOL 350 MG/ML SOLN

[Series 2: cap with · axial · 0.85mm/px · z∈[-874,-299]mm · 10 of 141 slices shown, 12 images]
[im 13/141  mediastinal]
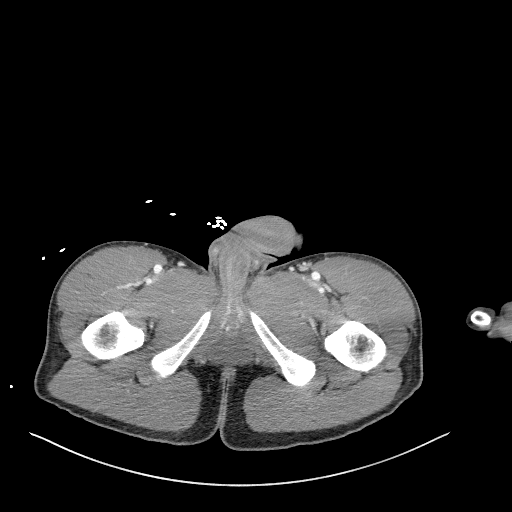
[im 13/141  bone]
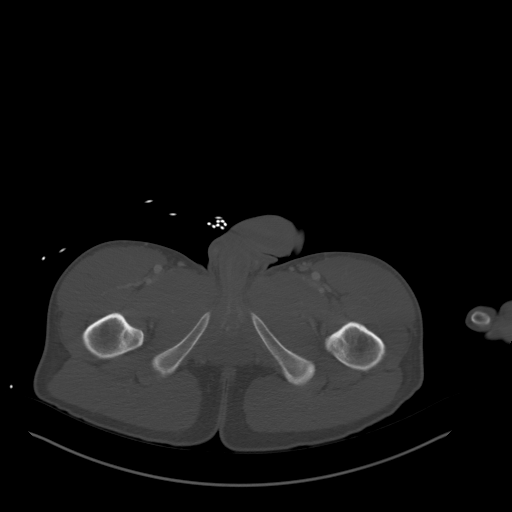
[im 26/141  mediastinal]
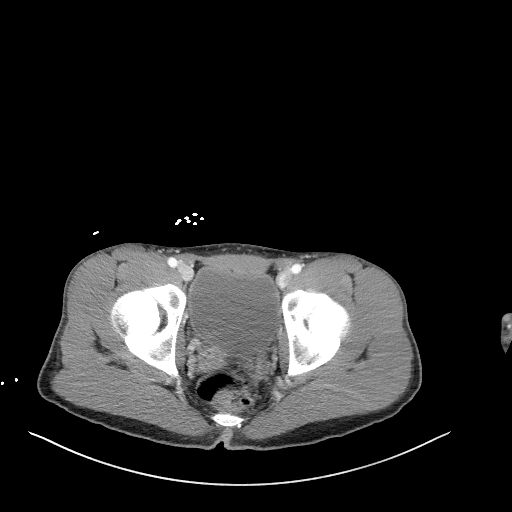
[im 39/141  mediastinal]
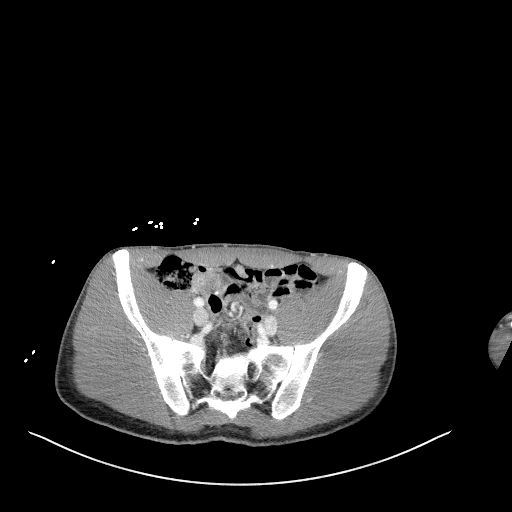
[im 51/141  mediastinal]
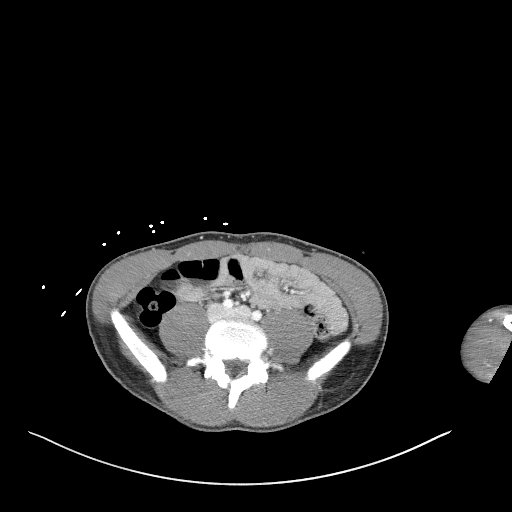
[im 64/141  mediastinal]
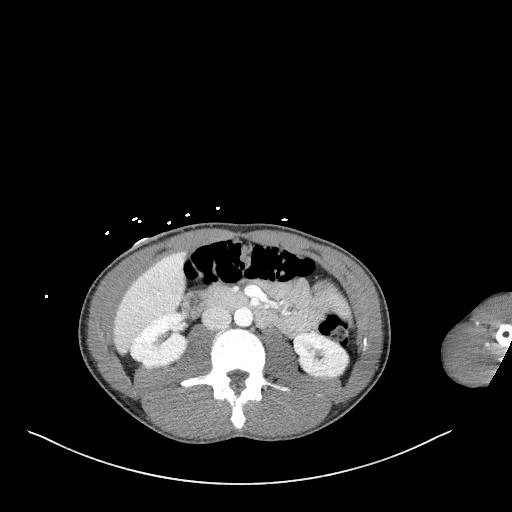
[im 77/141  mediastinal]
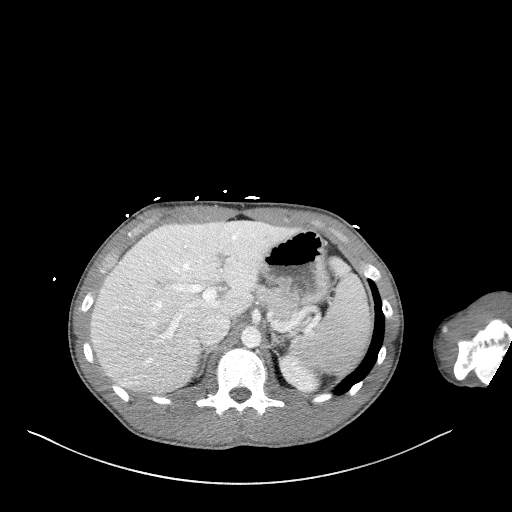
[im 90/141  mediastinal]
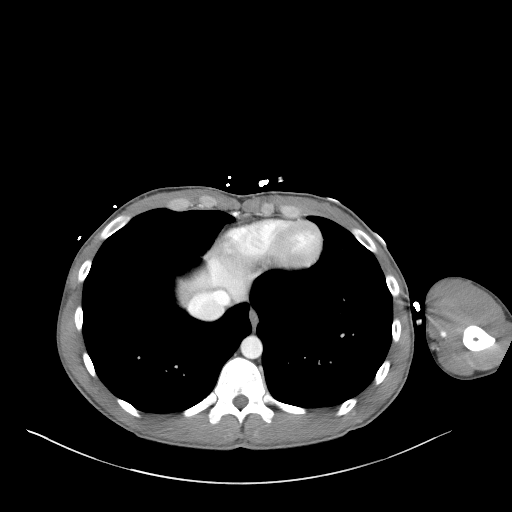
[im 102/141  mediastinal]
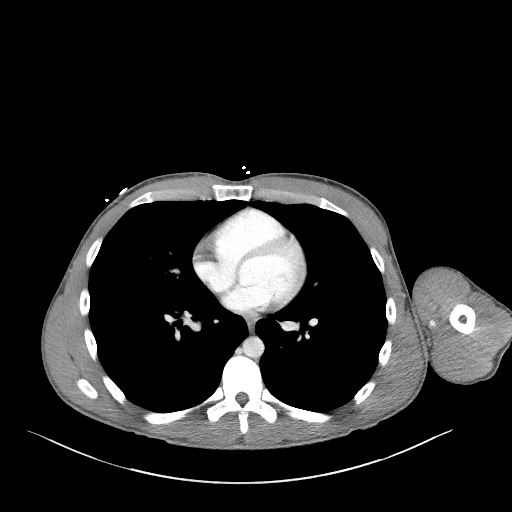
[im 115/141  mediastinal]
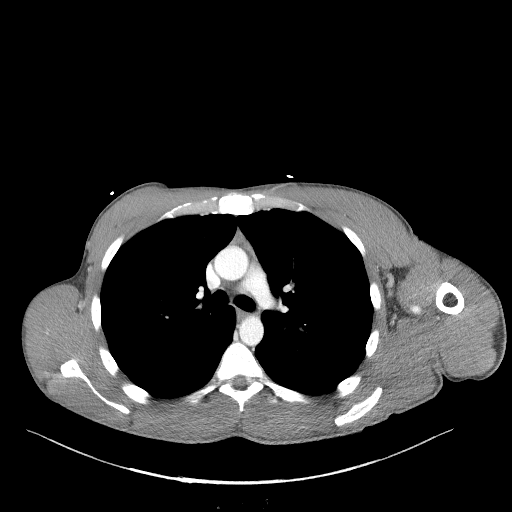
[im 115/141  bone]
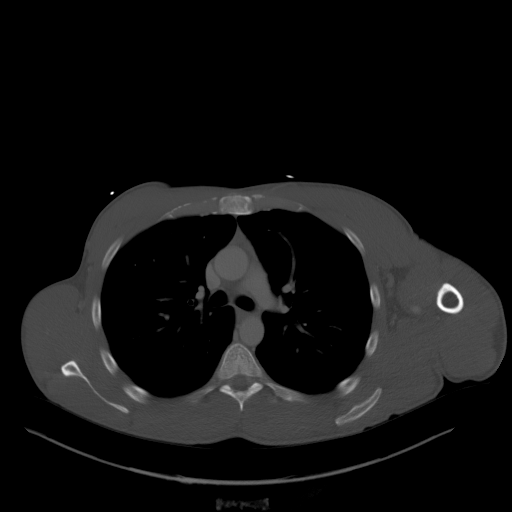
[im 128/141  mediastinal]
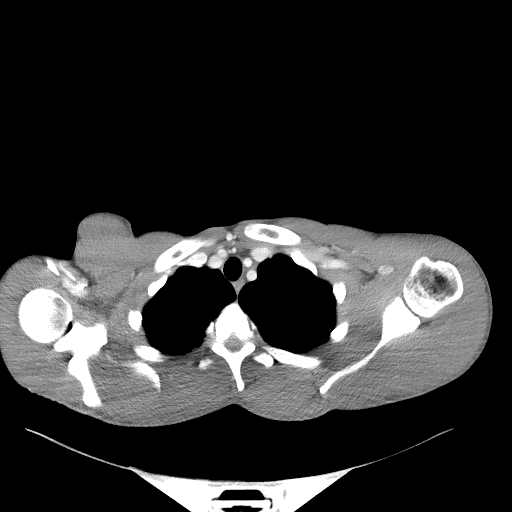

[Series 6: coronals · coronal · 0.68mm/px · 3 of 123 slices shown]
[im 25/123  mediastinal]
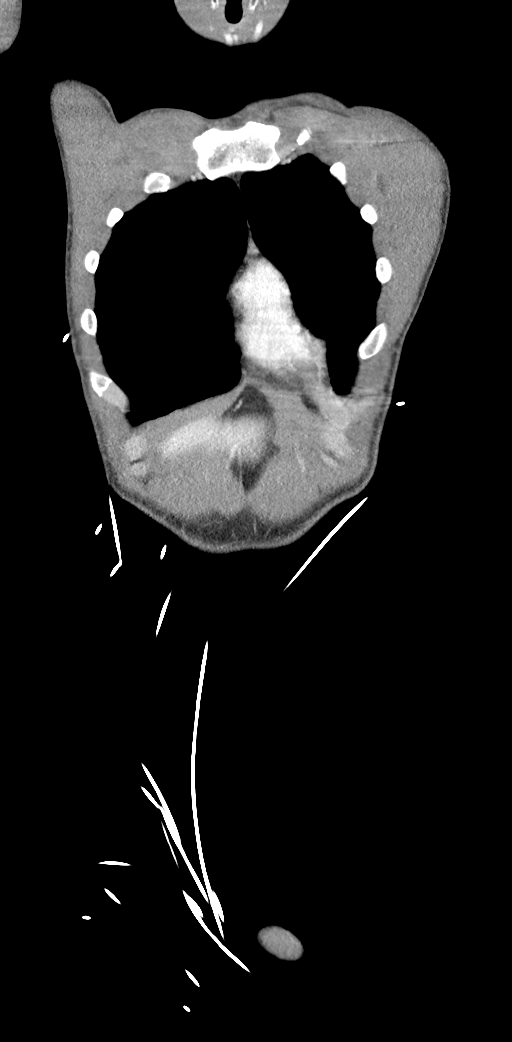
[im 49/123  mediastinal]
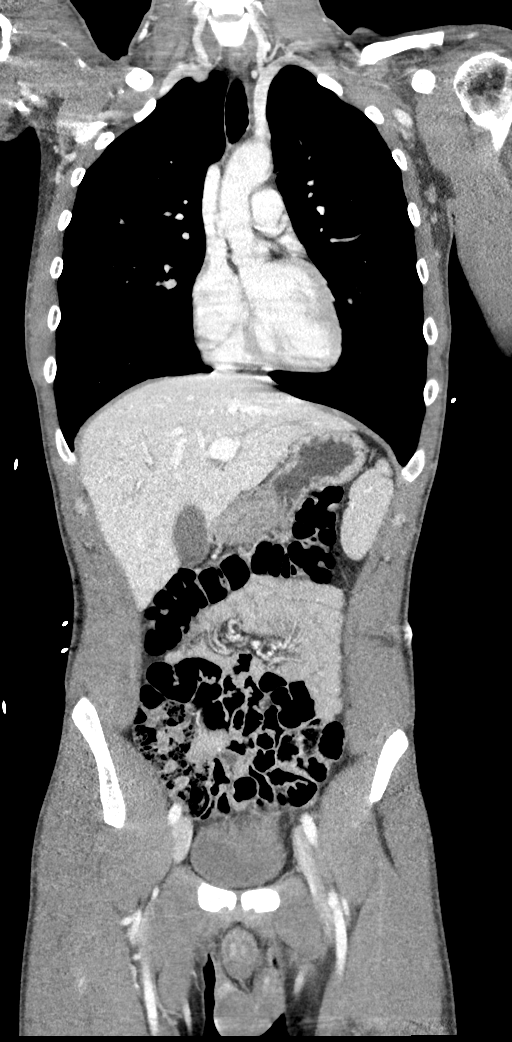
[im 74/123  mediastinal]
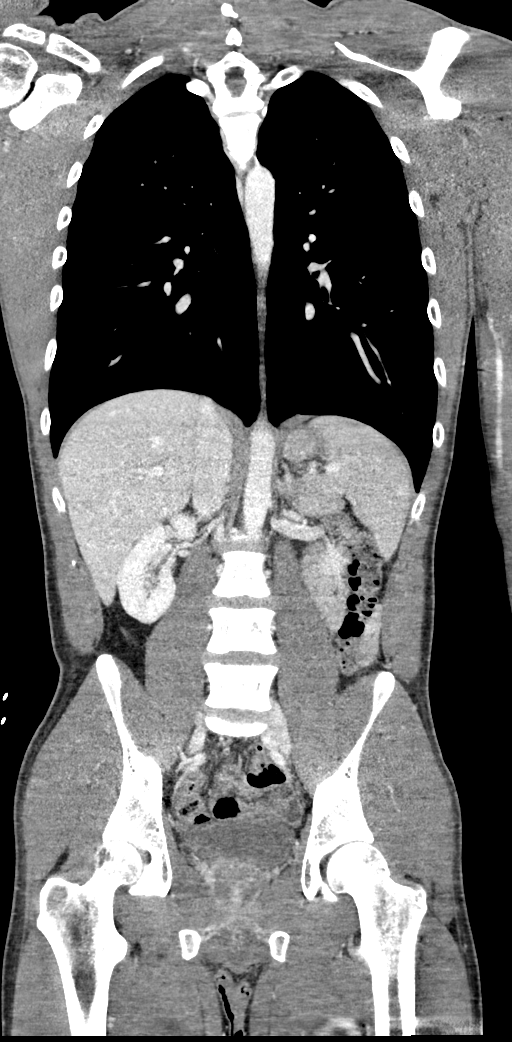

[13 of 36 positions shown; findings below may reference images not displayed]

FINDINGS: CT CHEST FINDINGS

Cardiovascular: The heart is normal in size. No pericardial
effusion. The aorta is normal in caliber. No dissection. The branch
vessels are patent. The pulmonary arteries appear normal.

Mediastinum/Nodes: No mediastinal or hilar mass or adenopathy or
hematoma. Some residual normal thymic tissue noted in the anterior
mediastinum. The esophagus is grossly normal. The thyroid gland is
unremarkable.

Lungs/Pleura: The lungs are clear. No pulmonary contusion,
pneumothorax or pleural effusion. No pulmonary lesions.

Musculoskeletal: No rib, sternal or thoracic vertebral body
fractures are identified.

CT ABDOMEN PELVIS FINDINGS

Hepatobiliary: No acute hepatic injury or perihepatic fluid
collection. The gallbladder is unremarkable. No common bile duct
dilatation.

Pancreas: No mass, inflammation or ductal dilatation. No findings
for acute pancreatic injury and no peripancreatic fluid collections.

Spleen: Normal size. No acute injury or perisplenic fluid
collection.

Adrenals/Urinary Tract: The adrenal glands and kidneys are
unremarkable. No acute renal injury or perinephric fluid collection.
The bladder is unremarkable.

Stomach/Bowel: The stomach, duodenum, small bowel and colon are
grossly normal. No CT findings to suggest an acute bowel injury. No
free fluid or free air.

Vascular/Lymphatic: The aorta is normal in caliber. No dissection.
The branch vessels are patent. The major venous structures are
patent. No mesenteric or retroperitoneal mass or adenopathy. Small
scattered lymph nodes are noted.

Reproductive: The prostate gland and seminal vesicles are
unremarkable.

Other: No pelvic mass or adenopathy. No free pelvic fluid
collections. No inguinal mass or adenopathy. No abdominal wall
hernia or subcutaneous lesions.

Musculoskeletal: The lumbar vertebral bodies are normally aligned.
No fracture. The facets are normally aligned. No facet or laminar
fractures. No pars defects.

The pubic symphysis and SI joints are intact. Both hips are normally
located. No pelvic fractures or bone lesions.
IMPRESSION: Unremarkable CT examination of the chest, abdomen and pelvis. No
acute injury is identified.

## 2022-12-15 DIAGNOSIS — M545 Low back pain, unspecified: Secondary | ICD-10-CM | POA: Diagnosis not present

## 2022-12-15 DIAGNOSIS — X500XXA Overexertion from strenuous movement or load, initial encounter: Secondary | ICD-10-CM | POA: Diagnosis not present

## 2022-12-15 NOTE — ED Triage Notes (Addendum)
Ambulatory to triage. A+Ox4. NAD.  Reports left back pain radiating into quad with some toe numbness. Has been picking up concrete bags past few days. Tightness up into T spine. Equal strength in both LE.  Also reports decreased appetite past several days -N/-V/-CP/-SOB. Reports last BM 3-4 days ago, typically has one every day. No urinary changes reported. Afebrile.

## 2022-12-16 ENCOUNTER — Emergency Department (HOSPITAL_BASED_OUTPATIENT_CLINIC_OR_DEPARTMENT_OTHER)
Admission: EM | Admit: 2022-12-16 | Discharge: 2022-12-16 | Disposition: A | Discharge status: Home / Self Care | Payer: BC Managed Care – PPO | Attending: Emergency Medicine | Admitting: Emergency Medicine

## 2022-12-16 ENCOUNTER — Other Ambulatory Visit: Payer: Self-pay

## 2022-12-16 DIAGNOSIS — M545 Low back pain, unspecified: Secondary | ICD-10-CM | POA: Diagnosis not present

## 2022-12-16 DIAGNOSIS — S39012A Strain of muscle, fascia and tendon of lower back, initial encounter: Secondary | ICD-10-CM

## 2022-12-16 MED ORDER — CYCLOBENZAPRINE HCL 10 MG PO TABS: 10.0000 mg | ORAL_TABLET | Freq: Three times a day (TID) | ORAL | 0 refills | Status: AC | PRN

## 2022-12-16 MED ORDER — CYCLOBENZAPRINE HCL 10 MG PO TABS
10.0000 mg | ORAL_TABLET | Freq: Once | ORAL | Status: AC
  Administered 2022-12-16: 10 mg via ORAL
  Filled 2022-12-16: qty 1

## 2022-12-16 MED ORDER — IBUPROFEN 400 MG PO TABS
600.0000 mg | ORAL_TABLET | Freq: Once | ORAL | Status: AC
  Administered 2022-12-16: 600 mg via ORAL
  Filled 2022-12-16: qty 1

## 2022-12-16 NOTE — ED Provider Notes (Signed)
[place] EMERGENCY DEPARTMENT AT [facility] Provider Note   CSN: [id number] Arrival date & time: 12/15/22  2349     History  Chief Complaint  Patient presents with   Back Pain    Dean Hudson is a 27 y.o. male.  Patient is a 27 year old male with history of migraines.  Patient presenting today with complaints of back pain.  He describes a 2-day history of pain in his low back that started when lifting a heavy object.  He denies any radiation of his pain into his legs.  He denies any bowel or bladder complaints.  He denies any weakness or numbness.  Pain is worse with movement, bending with no alleviating factors.  The history is provided by the patient.       Home Medications Prior to Admission medications   Medication Sig Start Date End Date Taking? Authorizing Provider  acetaminophen (TYLENOL) 325 MG tablet Take 2 tablets (650 mg total) by mouth every 6 (six) hours as needed. 02/11/20   Sharman Cheek, MD  cetirizine (ZYRTEC ALLERGY) 10 MG tablet Take 1 tablet (10 mg total) by mouth daily. 02/21/19   Enid Derry, PA-C  ketorolac (TORADOL) 10 MG tablet Take 1 tablet (10 mg total) by mouth every 6 (six) hours as needed for moderate pain. 02/11/20   Sharman Cheek, MD  Olopatadine HCl (PATANASE) 0.6 % SOLN Place 1 spray into the nose 2 (two) times daily as needed. 02/21/19   Enid Derry, PA-C  pantoprazole (PROTONIX) 20 MG tablet Take 1 tablet (20 mg total) by mouth daily. 01/19/14 01/20/15  Jon Gills, MD  SUMAtriptan (IMITREX) 50 MG tablet Take one tablet at onset of migraine with nonsteroidal medication, may repeat in 2 hours if headache persists or recurs. 05/16/14   Deetta Perla, MD  topiramate (TOPAMAX) 25 MG tablet Take 2 tablets at nighttime 05/16/14   Deetta Perla, MD      Allergies    Patient has no known allergies.    Review of Systems   Review of Systems  All other systems reviewed and are negative.   Physical Exam Updated  Vital Signs BP (!) 145/90   Pulse 88   Temp 98.5 F (36.9 C)   Resp 17   Ht 5\' 8"  (1.727 m)   Wt 67.1 kg   SpO2 98%   BMI 22.50 kg/m  Physical Exam Vitals and nursing note reviewed.  Constitutional:      General: He is not in acute distress.    Appearance: He is well-developed. He is not diaphoretic.  HENT:     Head: Normocephalic and atraumatic.  Pulmonary:     Effort: Pulmonary effort is normal.  Musculoskeletal:        General: Normal range of motion.     Cervical back: Normal range of motion and neck supple.     Comments: There is tenderness to palpation in the soft tissues of the mid lumbar region.  There is no bony tenderness or step-off.  Skin:    General: Skin is warm and dry.  Neurological:     Mental Status: He is alert and oriented to person, place, and time.     Coordination: Coordination normal.     Comments: Drink is 5 out of 5 in both lower extremities.  DTRs are 2+ and symmetrical in the patellar and Achilles tendons bilaterally.  He is able to walk on heels and toes.     ED Results / Procedures / Treatments  Labs (all labs ordered are listed, but only abnormal results are displayed) Labs Reviewed - No data to display  EKG None  Radiology No results found.  Procedures Procedures    Medications Ordered in ED Medications  cyclobenzaprine (FLEXERIL) tablet 10 mg (has no administration in time range)  ibuprofen (ADVIL) tablet 600 mg (has no administration in time range)    ED Course/ Medical Decision Making/ A&P  Patient presenting with low back pain as described in the HPI.  This started after lifting a heavy object.  Physical examination reveals symmetrical reflexes and normal strength throughout both lower extremities.  There are no red flags in the history that would suggest an emergent situation.  I feels the patient can safely be discharged with NSAIDs, Flexeril, and follow-up as needed.  Final Clinical Impression(s) / ED Diagnoses Final  diagnoses:  None    Rx / DC Orders ED Discharge Orders     None         Geoffery Lyons, MD 12/16/22 [id number]

## 2022-12-16 NOTE — Discharge Instructions (Signed)
Begin taking ibuprofen 600 mg every 6 hours as needed for pain.  Begin taking Flexeril as needed for pain not relieved with ibuprofen.  Follow-up with primary doctor if symptoms are not improving in the next week.

## 2023-06-14 ENCOUNTER — Encounter (HOSPITAL_COMMUNITY): Payer: Self-pay | Admitting: Emergency Medicine

## 2023-06-14 ENCOUNTER — Other Ambulatory Visit: Payer: Self-pay

## 2023-06-14 ENCOUNTER — Emergency Department (HOSPITAL_COMMUNITY)
Admission: EM | Admit: 2023-06-14 | Discharge: 2023-06-14 | Disposition: A | Discharge status: Home / Self Care | Payer: Worker's Compensation | Attending: Emergency Medicine | Admitting: Emergency Medicine

## 2023-06-14 DIAGNOSIS — Z7721 Contact with and (suspected) exposure to potentially hazardous body fluids: Secondary | ICD-10-CM | POA: Diagnosis present

## 2023-06-14 LAB — RAPID HIV SCREEN (HIV 1/2 AB+AG)
HIV 1/2 Antibodies: NONREACTIVE
HIV-1 P24 Antigen - HIV24: NONREACTIVE

## 2023-06-14 LAB — HEPATITIS PANEL, ACUTE
HCV Ab: NONREACTIVE
Hep A IgM: NONREACTIVE
Hep B C IgM: NONREACTIVE
Hepatitis B Surface Ag: NONREACTIVE

## 2023-06-14 NOTE — Discharge Instructions (Signed)
 You have been screened for potential blood-borne pathogen.  Your HIV test as well as a hepatitis test came back negative.  You may follow-up with your doctor for further care.
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
